# Patient Record
Sex: Female | Born: 1995 | Race: Black or African American | Hispanic: No | Marital: Married | Smoking: Never smoker
Health system: Southern US, Community
[De-identification: ages and names within clinical notes are randomized; demographics above are authoritative.]

## PROBLEM LIST (undated history)

## (undated) DIAGNOSIS — M419 Scoliosis, unspecified: Secondary | ICD-10-CM

## (undated) HISTORY — DX: Scoliosis, unspecified: M41.9

---
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
## 2018-02-22 ENCOUNTER — Other Ambulatory Visit: Payer: Self-pay

## 2018-02-22 ENCOUNTER — Ambulatory Visit (INDEPENDENT_AMBULATORY_CARE_PROVIDER_SITE_OTHER): Payer: BLUE CROSS/BLUE SHIELD | Admitting: Physician Assistant

## 2018-02-22 ENCOUNTER — Encounter: Payer: Self-pay | Admitting: Physician Assistant

## 2018-02-22 ENCOUNTER — Ambulatory Visit (INDEPENDENT_AMBULATORY_CARE_PROVIDER_SITE_OTHER): Payer: BLUE CROSS/BLUE SHIELD

## 2018-02-22 VITALS — BP 132/86 | HR 78 | Temp 99.1°F | Resp 16 | Ht 71.0 in | Wt 200.8 lb

## 2018-02-22 DIAGNOSIS — T148XXA Other injury of unspecified body region, initial encounter: Secondary | ICD-10-CM

## 2018-02-22 NOTE — Progress Notes (Signed)
    02/22/2018 12:33 PM   DOB: 02/10/1996 / MRN: [id number]  SUBJECTIVE:  Michele Foley is a 22 y.o. female presenting for finger pain after slamming his right middle finger in a car door 3 days ago.  Tells me the nail is now black.  He is not having exquisite pain.  He denies a change in function of the digit.  She has No Known Allergies.   She  has a past medical history of Scoliosis.    She  reports that  has never smoked. she has never used smokeless tobacco. She  has no sexual activity history on file. The patient  has no past surgical history on file.  Her family history includes Cancer in her maternal grandmother; Hyperlipidemia in her maternal grandfather.  ROS per HPI  The problem list and medications were reviewed and updated by myself where necessary and exist elsewhere in the encounter.   OBJECTIVE:  BP 132/86   Pulse 78   Temp 99.1 F (37.3 C) (Oral)   Resp 16   Ht 5\' 11"  (1.803 m)   Wt 200 lb 12.8 oz (91.1 kg)   SpO2 98%   BMI 28.01 kg/m   Physical Exam  Constitutional: She is active.  Non-toxic appearance.  Cardiovascular: Normal rate.  Pulmonary/Chest: Effort normal. No tachypnea.  Musculoskeletal:       Hands: Neurological: She is alert.  Skin: Skin is warm and dry. She is not diaphoretic. No pallor.    No results found for this or any previous visit (from the past 72 hour(s)).  Dg Finger Middle Right  Result Date: 02/22/2018 CLINICAL DATA:  Crush injury of the third digit. EXAM: RIGHT MIDDLE FINGER 2+V COMPARISON:  None in PACs FINDINGS: The bones of the third digit are subjectively adequately mineralized. The tuft is intact. No fracture is observed elsewhere in the third finger. The joint spaces are well maintained. IMPRESSION: There is no acute fracture or dislocation of the right third finger. Electronically Signed   By: David  SwazilandJordan M.D.   On: 02/22/2018 12:18    ASSESSMENT AND PLAN:  Michele CoupeJaylen was seen today for mid finger.  Diagnoses and  all orders for this visit:  Crush injury: X-rays normal.  Reassurance provided to patient.  Advised ibuprofen 600 mg every 6-8 hours for pain control as needed. -     DG Finger Middle Right; Future    The patient is advised to call or return to clinic if she does not see an improvement in symptoms, or to seek the care of the closest emergency department if she worsens with the above plan.   Deliah BostonMichael Makenzee Choudhry, MHS, PA-C Primary Care at [facility] 02/22/2018 12:33 PM

## 2018-02-22 NOTE — Patient Instructions (Signed)
     IF you received an x-ray today, you will receive an invoice from [facility]. Please contact [facility] at [phone] with questions or concerns regarding your invoice.   IF you received labwork today, you will receive an invoice from [facility]. Please contact [facility] at [phone] with questions or concerns regarding your invoice.   Our billing staff will not be able to assist you with questions regarding bills from these companies.  You will be contacted with the lab results as soon as they are available. The fastest way to get your results is to activate your My Chart account. Instructions are located on the last page of this paperwork. If you have not heard from us regarding the results in 2 weeks, please contact this office.     

## 2019-06-13 IMAGING — DX DG FINGER MIDDLE 2+V*R*
3 series · 3 of 3 positions shown · non-contrast
Comparison: None in PACs

CLINICAL DATA: Crush injury of the third digit.

EXAM:
RIGHT MIDDLE FINGER 2+V

[finger ap]
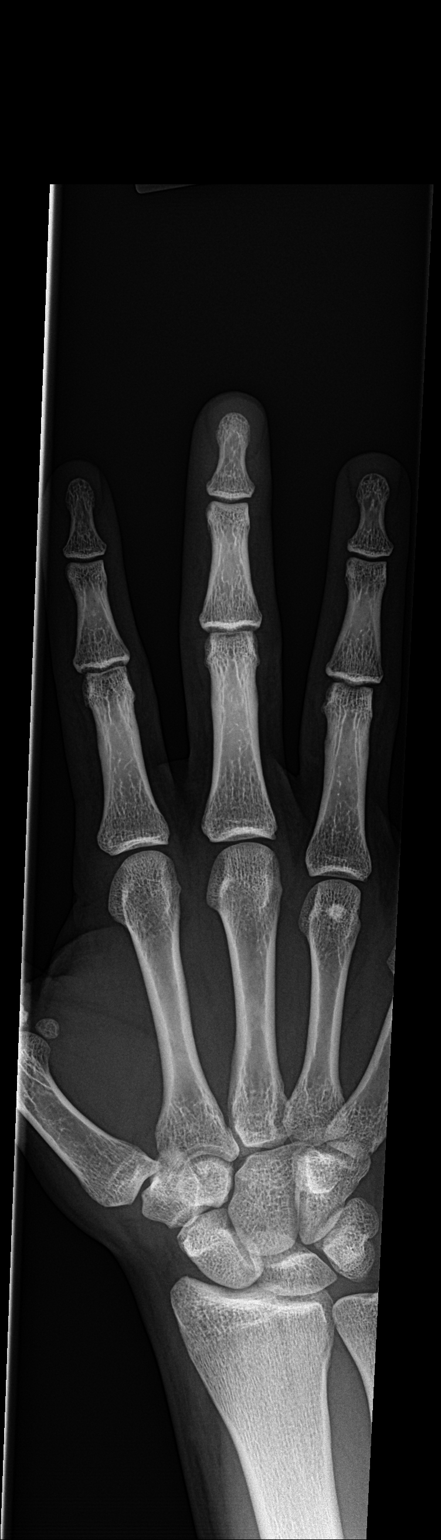

[finger obl]
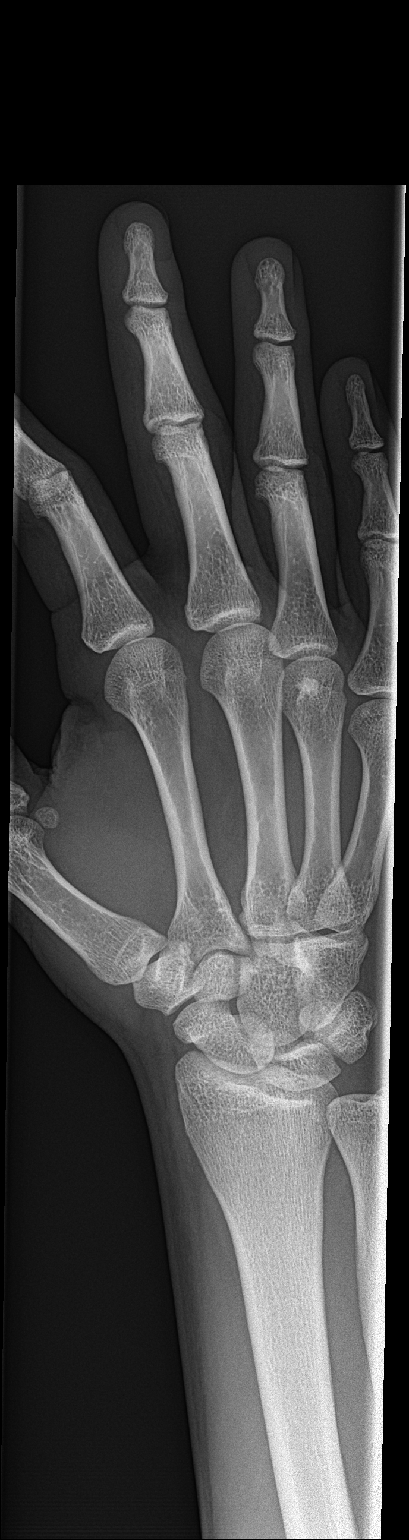

[finger lat]
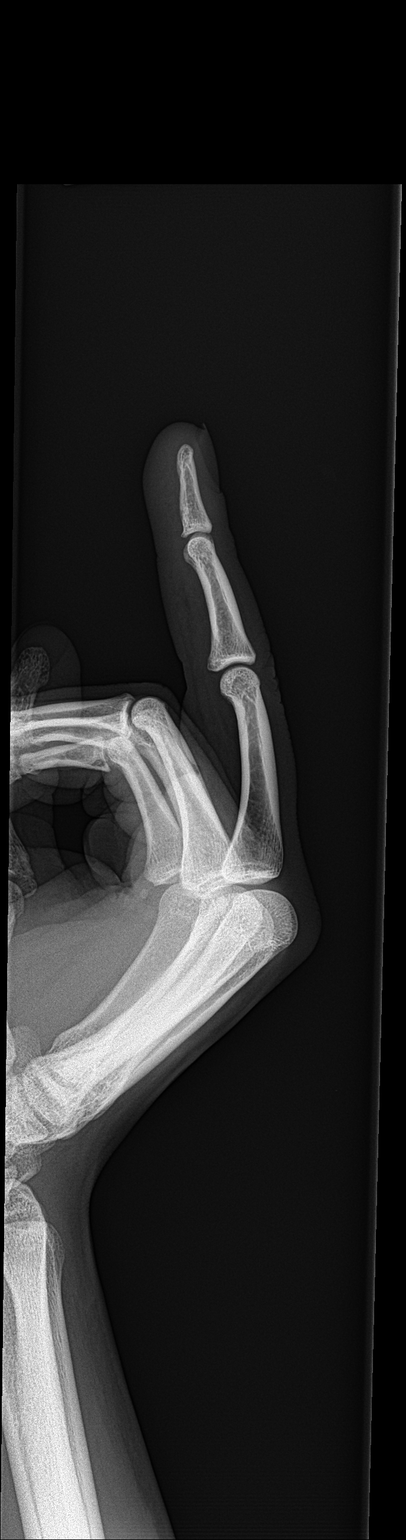

[3 of 3 positions shown; findings below may reference images not displayed]

FINDINGS: The bones of the third digit are subjectively adequately
mineralized. The tuft is intact. No fracture is observed elsewhere
in the third finger. The joint spaces are well maintained.
IMPRESSION: There is no acute fracture or dislocation of the right third finger.
